# Patient Record
Sex: Female | Born: 1975
Health system: Southern US, Community
[De-identification: ages and names within clinical notes are randomized; demographics above are authoritative.]

## PROBLEM LIST (undated history)

## (undated) HISTORY — PX: OTHER SURGICAL HISTORY: SHX169

---

## 1998-09-30 ENCOUNTER — Emergency Department (HOSPITAL_COMMUNITY): Admission: EM | Admit: 1998-09-30 | Discharge: 1998-09-30 | Payer: Self-pay | Admitting: Emergency Medicine

## 2001-02-22 ENCOUNTER — Ambulatory Visit (HOSPITAL_BASED_OUTPATIENT_CLINIC_OR_DEPARTMENT_OTHER): Admission: RE | Admit: 2001-02-22 | Discharge: 2001-02-22 | Payer: Self-pay | Admitting: General Surgery

## 2001-02-22 ENCOUNTER — Encounter (INDEPENDENT_AMBULATORY_CARE_PROVIDER_SITE_OTHER): Payer: Self-pay | Admitting: Specialist

## 2008-07-14 ENCOUNTER — Encounter (INDEPENDENT_AMBULATORY_CARE_PROVIDER_SITE_OTHER): Payer: Self-pay | Admitting: General Surgery

## 2008-07-14 ENCOUNTER — Ambulatory Visit (HOSPITAL_BASED_OUTPATIENT_CLINIC_OR_DEPARTMENT_OTHER): Admission: RE | Admit: 2008-07-14 | Discharge: 2008-07-14 | Payer: Self-pay | Admitting: General Surgery

## 2010-12-28 NOTE — Op Note (Signed)
Patricia Walters                ACCOUNT NO.:  [id number]   MEDICAL RECORD NO.:  [id number]          PATIENT TYPE:  AMB   LOCATION:  [facility]                         FACILITY:  [facility]   PHYSICIAN:  Anselm Pancoast. Weatherly, M.D.DATE OF BIRTH:  01/27/1976   DATE OF PROCEDURE:  07/14/2008  DATE OF DISCHARGE:                               OPERATIVE REPORT   PREOPERATIVE DIAGNOSIS:  Recurrent hidradenitis left axilla.   OPERATION:  Excision of chronic hidradenitis left axilla with multiple  small abscesses.   HISTORY:  Patricia Walters is a 35 year old black female.  She has been was  seen in our office on numerous occasions for a hidradenitis.  She has  had problems on both the left and right.  I did a primary excision of  the area on the right and she has done well.  She was seen in August for  I and D on the left and wanted to go ahead and proceed, but then she had  been on Septra off and on and she works and elected not to proceed since  it appeared that she was getting better.  Then I saw her back in the  office on June 19, 2008, when she was having problems with purulent  drainage, taking Vicodin, she has been on Septra.  I saw her in the  office and lanced a little small area for a culture and this grew a  Proteus, and she desired to go ahead and plan on having a wide excision  as she had, had done on the right.  When I saw her at that time, she was  on the Septra which the Proteus appeared to be sensitive to the Septra,  but when she came today she had obviously multiple areas that were  swollen and tender.  There are other areas that have just started to  drain and she desires to go ahead and proceed with definitive surgery.  I discussed with her that I might not be able to actually close the  area, but we will see and she understands and we took her on back to the  OR.   DESCRIPTION OF PROCEDURE:  She was given a gram of Ancef, since this was  sensitive on the previous biopsy and  the Septra that she has been on  really does not appear to be controlling the issues and we have never  cultured any thing that was actually MRSA.  I then after induction of  general anesthesia with an LOA tube, lifted up the left arm and prepped  her widely with Betadine solution and then slipped a half sheet up under  the axilla and then draped the remaining areas with towels and then a  lap.  I used a skin marker to kind of mark around all the areas that  were palpable and this area is shaped like a drumstick in that it is  pretty large medially and then kind of like a little stick more lateral  where the newer areas of drainage are in the lateral area and then the  areas that are medially  in the meat of the drumstick area of course was  not this way saw her in the office about a week ago.  I then marked the  skin where I will need to make the incision to remove all of these  various areas.  I then starting inferiorly at the broad area, picking up  the skin with double skin hooks, dissecting down in basically non-  infected area and then took the inferior incision laterally.  I then  started up again in the top, lifting up the skin, kind of at the  axillary crease and dissected down.  There were numerous little blood  vessels that were quite large that were either sutured or tied with 4-0  Vicryl, and then all this chronic infected tissue was excised right down  to the actual pectoral fascia.  Then the lateral aspect of the skin  removed was only about an inch wide and there were several areas of  lateral bleeding also.  Good hemostasis was obtained.  I did not elevate  any flaps.  I think it would come together with her arm down.  After a  48 Blake had been placed in the depths of the wound and brought out  laterally, I did put a little Betadine solution in the wound and then  closed it with some interrupted sutures of 4-0 Vicryl, then little  mattress sutures about an inch apart of  3-0 nylon, then interrupted 4-0  nylon simple sutures and then staples between each of these.  The drain  had been sutured to the skin and estimated blood loss was probably 150  mL.  Then, I put Vaseline gauze on the skin to get a good seal.  The  Blake drain does have a good seal.  Then some 4x4s laid out flat and  then an ABD and then this held in place with the 4 inch Hypafix.  The  patient tolerated the procedure nicely and she will be released to be  followed up in our office on Thursday.  We will await the results of the  culture.  Her mother will be  assisting in her care, and I am going to place her on Keflex since this  was definitely sensitive to the Keflex previously.  We opened on the  back and sent some samples of pus and the infected tissue for aerobic  and anaerobic cultures to see if any additional bacteria was coming up.  She is at [facility], and will be discharged later today.           ______________________________  Anselm Pancoast. Zachery Dakins, M.D.     WJW/MEDQ  D:  07/14/2008  T:  07/14/2008  Job:  [id number]

## 2010-12-31 NOTE — Op Note (Signed)
[facility]  Patient:    Patricia Walters, Patricia Walters                         MRN: [id number] Proc. Date: 02/22/01 Adm. Date:  [id number] Attending:  Henrene Dodge                           Operative Report  PREOPERATIVE DIAGNOSIS:  Bilateral hidradenitis, axilla and groin.  POSTOPERATIVE DIAGNOSIS:  Excision of complex hidradenitis, both axillae, smaller lesions both left and right anterior groin.  ANESTHESIA:  General anesthesia.  SURGEON:  Anselm Pancoast. Zachery Dakins, M.D.  HISTORY:  Patricia Walters is a 35 year old black female who has had a long history of hidradenitis.  She has been on antibiotics intermittently but has never had any real surgical procedures, and she has recently graduated from college.  She was seen in the urgent care center about a month ago and placed on doxycycline and advised to see a Careers adviser.  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  She said she was somewhat better with the oral antibiotics, is not having any insurance coverage, since she had just graduated from college and was looking for employment and was not on her mothers policy and wanted to wait until she got health insurance before proceeding with any type of surgical procedure.  She had some many areas that it would have been impossible to try to drain any of these in the office, even though some of them are actually chronically draining fluid.  I have seen her twice since then, and the area is kind of smoldering along, and she is for planned surgery at this time.  I saw her earlier in the week.  She has about four large complex areas of the right axilla, about three in the left, two on each of the anterior groin area, both left and right.  The patient has been on doxycycline, and preoperatively she was given 1 g of  Kefzol.  DESCRIPTION OF PROCEDURE:  The patient was taken to the operative suite, induction of general anesthesia, endotracheal tube, and then positioned so that her legs were kind of slightly spread a little bit and we could get to both axillae, and I elected to do the groin areas, since they were smaller, first.  This area was prepped with Betadine solution, draped in a sterile manner.  I shaved all of this with the clippers.  The areas first on the left and then right, and they were about two kind of complex areas about the size of a fifty-cent piece, with chronic pus, infection, etc., and these were excised.  The bleeders were controlled with either cautery or 3-0 or 4-0 chromic sutures, and then I loosely closed the skin, placed a little iodoform gauze within the actual cavity, brought it out through the incision.  The same was used on both sides, and then multiple groin incisions, all told I think there were five or six areas in the groin, two large on one side and two large and the other two smaller on the right side.  Next, we went to the right axilla, and this was the worst, and I was hoping to just kind of excise the areas and not actually do a formal area, but  the area on the right, the fistulas all complex, draining, multiple communication, and I ultimately removed a large ellipse of skin, probably about two inches wide and about five inches in length.  That removes most of the hairbearing area on the right axilla.  There was a little area kind of going down into the arm that did not connect with these, and so she has a little bit of a T-type incision. Numerous bleeders were sutured or cauterized, and then I closed the area with just simple sutures with a Penrose drain in the large incision and iodoform in the smaller incision.  The left axilla was done last, and it basically had three large areas.  Each of these was about the size of a fifty-cent piece or larger, with multiple  complex abscesses, etc., and the chronic infected area was removed and then the skin was closed with iodoform gauze within the areas. I will see her in the office tomorrow for a wound check and hopefully remove the packing probably Sunday, and then she will need to, of course, be on oral antibiotics and limited activity.  I am planning to just let her soak in the shower on a daily basis starting tomorrow evening if we are not having problems with bleeding. DD:  02/22/01 TD:  02/22/01 Job: [id number]

## 2011-02-14 ENCOUNTER — Other Ambulatory Visit: Payer: Self-pay | Admitting: Obstetrics and Gynecology

## 2011-02-14 DIAGNOSIS — N644 Mastodynia: Secondary | ICD-10-CM

## 2011-02-25 ENCOUNTER — Other Ambulatory Visit (HOSPITAL_COMMUNITY): Payer: Self-pay | Admitting: Diagnostic Radiology

## 2011-02-25 ENCOUNTER — Ambulatory Visit
Admission: RE | Admit: 2011-02-25 | Discharge: 2011-02-25 | Disposition: A | Discharge status: Home / Self Care | Payer: PRIVATE HEALTH INSURANCE | Source: Ambulatory Visit | Attending: Obstetrics and Gynecology | Admitting: Obstetrics and Gynecology

## 2011-02-25 ENCOUNTER — Other Ambulatory Visit: Payer: Self-pay | Admitting: Obstetrics and Gynecology

## 2011-02-25 ENCOUNTER — Other Ambulatory Visit: Payer: Self-pay | Admitting: Diagnostic Radiology

## 2011-02-25 DIAGNOSIS — N644 Mastodynia: Secondary | ICD-10-CM

## 2011-02-25 DIAGNOSIS — N63 Unspecified lump in unspecified breast: Secondary | ICD-10-CM

## 2011-05-17 LAB — DIFFERENTIAL
Basophils Absolute: 0 10*3/uL (ref 0.0–0.1)
Basophils Relative: 0 % (ref 0–1)
Eosinophils Absolute: 0.1 10*3/uL (ref 0.0–0.7)
Eosinophils Relative: 1 % (ref 0–5)
Lymphs Abs: 3 10*3/uL (ref 0.7–4.0)
Neutrophils Relative %: 58 % (ref 43–77)

## 2011-05-17 LAB — CBC
Hemoglobin: 12.9 g/dL (ref 12.0–15.0)
MCHC: 32.6 g/dL (ref 30.0–36.0)
RBC: 4.64 MIL/uL (ref 3.87–5.11)
WBC: 9.2 10*3/uL (ref 4.0–10.5)

## 2011-05-17 LAB — COMPREHENSIVE METABOLIC PANEL
ALT: 15 U/L (ref 0–35)
AST: 19 U/L (ref 0–37)
Alkaline Phosphatase: 64 U/L (ref 39–117)
CO2: 26 mEq/L (ref 19–32)
Chloride: 104 mEq/L (ref 96–112)
GFR calc Af Amer: >60 mL/min (ref >60)
GFR calc non Af Amer: 51 mL/min — ABNORMAL LOW (ref >60)
Glucose, Bld: 94 mg/dL (ref 70–99)
Potassium: 4.1 mEq/L (ref 3.5–5.1)
Sodium: 136 mEq/L (ref 135–145)
Total Bilirubin: 0.6 mg/dL (ref 0.3–1.2)

## 2011-05-17 LAB — CULTURE, ROUTINE-ABSCESS

## 2011-05-17 LAB — ANAEROBIC CULTURE: Gram Stain: NONE SEEN

## 2011-05-17 LAB — PREGNANCY, URINE: Preg Test, Ur: NEGATIVE

## 2012-08-24 ENCOUNTER — Other Ambulatory Visit: Payer: Self-pay | Admitting: Obstetrics and Gynecology

## 2012-08-24 DIAGNOSIS — Z1231 Encounter for screening mammogram for malignant neoplasm of breast: Secondary | ICD-10-CM

## 2012-09-20 ENCOUNTER — Ambulatory Visit
Admission: RE | Admit: 2012-09-20 | Discharge: 2012-09-20 | Disposition: A | Discharge status: Home / Self Care | Payer: PRIVATE HEALTH INSURANCE | Source: Ambulatory Visit | Attending: Obstetrics and Gynecology | Admitting: Obstetrics and Gynecology

## 2012-09-20 DIAGNOSIS — Z1231 Encounter for screening mammogram for malignant neoplasm of breast: Secondary | ICD-10-CM

## 2012-10-03 ENCOUNTER — Other Ambulatory Visit: Payer: Self-pay | Admitting: Obstetrics and Gynecology

## 2012-10-30 ENCOUNTER — Other Ambulatory Visit: Payer: PRIVATE HEALTH INSURANCE

## 2012-10-31 ENCOUNTER — Ambulatory Visit
Admission: RE | Admit: 2012-10-31 | Discharge: 2012-10-31 | Disposition: A | Discharge status: Home / Self Care | Payer: PRIVATE HEALTH INSURANCE | Source: Ambulatory Visit | Attending: Obstetrics and Gynecology | Admitting: Obstetrics and Gynecology

## 2013-08-15 HISTORY — PX: BREAST BIOPSY: SHX20

## 2017-11-15 DIAGNOSIS — Z01419 Encounter for gynecological examination (general) (routine) without abnormal findings: Secondary | ICD-10-CM | POA: Diagnosis not present

## 2017-11-15 DIAGNOSIS — Z6841 Body Mass Index (BMI) 40.0 and over, adult: Secondary | ICD-10-CM | POA: Diagnosis not present

## 2017-11-15 DIAGNOSIS — Z1231 Encounter for screening mammogram for malignant neoplasm of breast: Secondary | ICD-10-CM | POA: Diagnosis not present

## 2017-11-16 LAB — HM PAP SMEAR: HPV, high-risk: NOT DETECTED

## 2018-07-30 ENCOUNTER — Emergency Department (HOSPITAL_BASED_OUTPATIENT_CLINIC_OR_DEPARTMENT_OTHER)
Admission: EM | Admit: 2018-07-30 | Discharge: 2018-07-30 | Disposition: A | Discharge status: Home / Self Care | Payer: BLUE CROSS/BLUE SHIELD | Attending: Emergency Medicine | Admitting: Emergency Medicine

## 2018-07-30 ENCOUNTER — Telehealth: Payer: Self-pay

## 2018-07-30 ENCOUNTER — Other Ambulatory Visit: Payer: Self-pay

## 2018-07-30 ENCOUNTER — Encounter (HOSPITAL_BASED_OUTPATIENT_CLINIC_OR_DEPARTMENT_OTHER): Payer: Self-pay | Admitting: *Deleted

## 2018-07-30 DIAGNOSIS — F17210 Nicotine dependence, cigarettes, uncomplicated: Secondary | ICD-10-CM | POA: Diagnosis not present

## 2018-07-30 DIAGNOSIS — R55 Syncope and collapse: Secondary | ICD-10-CM | POA: Diagnosis not present

## 2018-07-30 NOTE — Telephone Encounter (Signed)
Copied from CRM [id number]. Topic: Appointment Scheduling - Scheduling Inquiry for Clinic >> Jul 30, 2018  1:41 PM Lorrine KinMcGee, Demi B, [place] wrote: Reason for CRM: Requesting to be a new patient of Dr Beverely Lowabori. Was referred by a current patient, Charlean MerlLetitia Wallace. Covered under [employer]. Please advise.

## 2018-07-30 NOTE — ED Triage Notes (Signed)
Last night she had abdominal pain and menstrual cramps. States she passed out in the bathroom. She had nausea and diarrhea. Woke this am and felt fine. Her friends wanted her to come get a check up.

## 2018-07-30 NOTE — Telephone Encounter (Signed)
Ok to establish but my new pt appts are out quite far if she needs to see someone sooner

## 2018-07-30 NOTE — Telephone Encounter (Signed)
Please advise 

## 2018-07-30 NOTE — ED Notes (Signed)
Patient denies pain and is resting comfortably.  

## 2018-07-30 NOTE — ED Provider Notes (Signed)
[facility] EMERGENCY DEPARTMENT Provider Note   CSN: [id number] Arrival date & time: 07/30/18  1539     History   Chief Complaint Chief Complaint  Patient presents with  . Fall    HPI Patricia Walters is a 42 y.o. female.  The history is provided by the patient.  Near Syncope  This is a new problem. The current episode started yesterday. The problem has been resolved. Pertinent negatives include no chest pain, no abdominal pain, no headaches and no shortness of breath. Nothing aggravates the symptoms. Nothing relieves the symptoms. She has tried nothing for the symptoms. The treatment provided no relief.    History reviewed. No pertinent past medical history.  There are no active problems to display for this patient.   Past Surgical History:  Procedure Laterality Date  . sweat gland surgery       OB History   No obstetric history on file.      Home Medications    Prior to Admission medications   Not on File    Family History No family history on file.  Social History Social History   Tobacco Use  . Smoking status: Current Every Day Smoker  . Smokeless tobacco: Never Used  Substance Use Topics  . Alcohol use: Not on file  . Drug use: Not on file     Allergies   Patient has no known allergies.   Review of Systems Review of Systems  Constitutional: Negative for chills and fever.  HENT: Negative for ear pain and sore throat.   Eyes: Negative for pain and visual disturbance.  Respiratory: Negative for cough and shortness of breath.   Cardiovascular: Positive for near-syncope. Negative for chest pain and palpitations.  Gastrointestinal: Negative for abdominal pain and vomiting.  Genitourinary: Negative for dysuria and hematuria.  Musculoskeletal: Negative for arthralgias and back pain.  Skin: Negative for color change and rash.  Neurological: Positive for light-headedness. Negative for seizures, syncope and headaches.  All other systems  reviewed and are negative.    Physical Exam Updated Vital Signs  ED Triage Vitals  Enc Vitals Group     BP 07/30/18 1604 130/75     Pulse Rate 07/30/18 1604 99     Resp 07/30/18 1604 18     Temp 07/30/18 1604 98.6 F (37 C)     Temp Source 07/30/18 1604 Oral     SpO2 07/30/18 1604 100 %     Weight 07/30/18 1605 284 lb 2.8 oz (128.9 kg)     Height 07/30/18 1605 5\' 5"  (1.651 m)     Head Circumference --      Peak Flow --      Pain Score 07/30/18 1618 0     Pain Loc --      Pain Edu? --      Excl. in GC? --     Physical Exam Vitals signs and nursing note reviewed.  Constitutional:      General: She is not in acute distress.    Appearance: She is well-developed.  HENT:     Head: Normocephalic and atraumatic.     Nose: Nose normal.     Mouth/Throat:     Mouth: Mucous membranes are dry.  Eyes:     Extraocular Movements: Extraocular movements intact.     Conjunctiva/sclera: Conjunctivae normal.     Pupils: Pupils are equal, round, and reactive to light.  Neck:     Musculoskeletal: Normal range of motion and neck supple.  Cardiovascular:     Rate and Rhythm: Normal rate and regular rhythm.     Pulses: Normal pulses.     Heart sounds: Normal heart sounds. No murmur.  Pulmonary:     Effort: Pulmonary effort is normal. No respiratory distress.     Breath sounds: Normal breath sounds.  Abdominal:     General: Abdomen is flat. There is no distension.     Palpations: Abdomen is soft.     Tenderness: There is no abdominal tenderness. There is no guarding.  Musculoskeletal: Normal range of motion.  Skin:    General: Skin is warm and dry.     Capillary Refill: Capillary refill takes less than 2 seconds.  Neurological:     General: No focal deficit present.     Mental Status: She is alert and oriented to person, place, and time.     Cranial Nerves: No cranial nerve deficit.     Sensory: No sensory deficit.     Motor: No weakness.     Coordination: Coordination normal.      Gait: Gait normal.     Deep Tendon Reflexes: Reflexes normal.  Psychiatric:        Mood and Affect: Mood normal.      ED Treatments / Results  Labs (all labs ordered are listed, but only abnormal results are displayed) Labs Reviewed - No data to display  EKG EKG Interpretation  Date/Time:  Monday July 30 2018 19:23:12 EST Ventricular Rate:  93 PR Interval:    QRS Duration: 92 QT Interval:  344 QTC Calculation: 428 R Axis:   75 Text Interpretation:  Sinus rhythm Consider right atrial enlargement Baseline wander in lead(s) V2 Confirmed by Virgina NorfolkAdam, Nuria Phebus [phone]([id number]) on 07/30/2018 7:29:19 PM   Radiology No results found.  Procedures Procedures (including critical care time)  Medications Ordered in ED Medications - No data to display   Initial Impression / Assessment and Plan / ED Course  I have reviewed the triage vital signs and the nursing notes.  Pertinent labs & imaging results that were available during my care of the patient were reviewed by me and considered in my medical decision making (see chart for details).     Patricia Walters is a 42 year old female with no significant medical history who presents the ED after possible syncopal event yesterday.  Patient with normal vitals.  No fever.  Patient states that she had some nausea, diarrhea yesterday.  She states while trying to have a bowel movement yesterday she fell to the floor, felt warm and flushed just before event.  She did not hit her head.  No headache, no neck pain.  Has felt fine all day today.  A friend told her to come get evaluated.  She has no current symptoms upon my evaluation.  EKG shows sinus rhythm.  Overall exam is reassuring.  No abdominal tenderness on exam.  Possible patient had a vasovagal episode.  No concerns for arrhythmia on EKG.  Normal vitals.  Patient currently on her menstrual cycle.  Overall gave patient reassurance and discharged patient in good condition.  Given return precautions.     Final Clinical Impressions(s) / ED Diagnoses   Final diagnoses:  Vasovagal episode    ED Discharge Orders    None       Virgina NorfolkCuratolo, Jennaya Pogue, DO 07/31/18 [id number]

## 2018-08-10 ENCOUNTER — Ambulatory Visit (INDEPENDENT_AMBULATORY_CARE_PROVIDER_SITE_OTHER): Payer: BLUE CROSS/BLUE SHIELD | Admitting: Family Medicine

## 2018-08-10 ENCOUNTER — Encounter: Payer: Self-pay | Admitting: Family Medicine

## 2018-08-10 DIAGNOSIS — E282 Polycystic ovarian syndrome: Secondary | ICD-10-CM | POA: Insufficient documentation

## 2018-08-10 DIAGNOSIS — L732 Hidradenitis suppurativa: Secondary | ICD-10-CM | POA: Diagnosis not present

## 2018-08-10 DIAGNOSIS — R55 Syncope and collapse: Secondary | ICD-10-CM | POA: Insufficient documentation

## 2018-08-10 NOTE — Assessment & Plan Note (Signed)
New to provider, recurrent problem for pt.  Not currently symptomatic.  Will treat as issues arise.  Pt expressed understanding and is in agreement w/ plan.

## 2018-08-10 NOTE — Assessment & Plan Note (Signed)
New.  Reviewed ER note and EKG.  No labs done.  Pt's episode sounds consistent w/ vasovagal episode but will check labs to assess for other cause of syncope.  Encouraged increased water intake.  Will follow.

## 2018-08-10 NOTE — Progress Notes (Signed)
   Subjective:    Patient ID: Patricia Walters, female    DOB: Dec 21, 1975, 42 y.o.   MRN: [id number]  HPI New to establish.  Previous MD- [facility]  GYN- [facility], UTD on pap and has mammo scheduled.  UTD on flu shot  Syncope- pt had a syncopal event on 12/15.  She awoke from sleep w/ nausea, went to bathroom.  Started sweating, nausea, dizziness.  Sat down and next thing she knew she was on the floor before the toilet and tub.  Afterwards had diarrhea and fatigue but was otherwise ok.    PCOS- pt has been following w/ GYN.  Did not tolerate OCPs  Hidradenitis- pt reports intermittent flares in bilateral axillae.  Pt is usually able to treat this at home w/ triple abx ointment.  At times will need to come in for abxs.    Obesity- BMI is 45.79.  No regular exercise.  Not following any particular diet.  Admits to 'horrible' eating habits.  Denies CP, SOB, HAs.  Some intermittent swelling of feet/ankles.   Review of Systems For ROS see HPI     Objective:   Physical Exam Vitals signs reviewed.  Constitutional:      General: She is not in acute distress.    Appearance: Normal appearance. She is well-developed. She is obese.  HENT:     Head: Normocephalic and atraumatic.  Eyes:     Conjunctiva/sclera: Conjunctivae normal.     Pupils: Pupils are equal, round, and reactive to light.  Neck:     Musculoskeletal: Normal range of motion and neck supple.     Thyroid: No thyromegaly.  Cardiovascular:     Rate and Rhythm: Normal rate and regular rhythm.     Heart sounds: Normal heart sounds. No murmur.  Pulmonary:     Effort: Pulmonary effort is normal. No respiratory distress.     Breath sounds: Normal breath sounds.  Abdominal:     General: There is no distension.     Palpations: Abdomen is soft.     Tenderness: There is no abdominal tenderness.  Lymphadenopathy:     Cervical: No cervical adenopathy.  Skin:    General: Skin is warm and dry.  Neurological:   Mental Status: She is alert and oriented to person, place, and time.  Psychiatric:        Behavior: Behavior normal.           Assessment & Plan:

## 2018-08-10 NOTE — Patient Instructions (Signed)
Schedule your complete physical in 6 months We'll notify you of your lab results and make any changes if needed Continue to drink plenty of fluids!! Try and make healthy food choices and get regular exercise- you can do it!! Call with any questions or concerns Welcome!  We're glad to have you! Happy New Year!!!

## 2018-08-10 NOTE — Assessment & Plan Note (Signed)
New to provider, ongoing for pt.  Stressed need for healthy diet and regular exercise.  Check labs to risk stratify.  Will follow. 

## 2018-08-10 NOTE — Assessment & Plan Note (Signed)
New to provider, ongoing for pt.  Following w/ GYN.  Was intolerant to OCPs.  Will follow along and assist as able.

## 2018-08-11 LAB — CBC WITH DIFFERENTIAL/PLATELET
Absolute Monocytes: 634 cells/uL (ref 200–950)
BASOS ABS: 21 {cells}/uL (ref 0–200)
Basophils Relative: 0.2 %
EOS ABS: 114 {cells}/uL (ref 15–500)
EOS PCT: 1.1 %
HEMATOCRIT: 37 % (ref 35.0–45.0)
HEMOGLOBIN: 12 g/dL (ref 11.7–15.5)
Lymphs Abs: 3255 cells/uL (ref 850–3900)
MCH: 26.7 pg — AB (ref 27.0–33.0)
MCHC: 32.4 g/dL (ref 32.0–36.0)
MCV: 82.2 fL (ref 80.0–100.0)
MPV: 10.5 fL (ref 7.5–12.5)
Monocytes Relative: 6.1 %
NEUTROS PCT: 61.3 %
Neutro Abs: 6375 cells/uL (ref 1500–7800)
Platelets: 322 10*3/uL (ref 140–400)
RBC: 4.5 10*6/uL (ref 3.80–5.10)
RDW: 14.1 % (ref 11.0–15.0)
Total Lymphocyte: 31.3 %
WBC: 10.4 10*3/uL (ref 3.8–10.8)

## 2018-08-11 LAB — HEPATIC FUNCTION PANEL
AG RATIO: 1 (calc) (ref 1.0–2.5)
ALBUMIN MSPROF: 3.8 g/dL (ref 3.6–5.1)
ALT: 10 U/L (ref 6–29)
AST: 14 U/L (ref 10–30)
Alkaline phosphatase (APISO): 54 U/L (ref 33–115)
BILIRUBIN DIRECT: 0.1 mg/dL (ref 0.0–0.2)
BILIRUBIN TOTAL: 0.2 mg/dL (ref 0.2–1.2)
Globulin: 3.8 g/dL (calc) — ABNORMAL HIGH (ref 1.9–3.7)
Indirect Bilirubin: 0.1 mg/dL (calc) — ABNORMAL LOW (ref 0.2–1.2)
Total Protein: 7.6 g/dL (ref 6.1–8.1)

## 2018-08-11 LAB — LIPID PANEL
CHOLESTEROL: 167 mg/dL (ref <200)
HDL: 52 mg/dL (ref >50)
LDL Cholesterol (Calc): 98 mg/dL (calc)
Non-HDL Cholesterol (Calc): 115 mg/dL (calc) (ref <130)
TRIGLYCERIDES: 80 mg/dL (ref <150)
Total CHOL/HDL Ratio: 3.2 (calc) (ref <5.0)

## 2018-08-11 LAB — BASIC METABOLIC PANEL
BUN: 12 mg/dL (ref 7–25)
CALCIUM: 9.2 mg/dL (ref 8.6–10.2)
CO2: 28 mmol/L (ref 20–32)
Chloride: 102 mmol/L (ref 98–110)
Creat: 1.08 mg/dL (ref 0.50–1.10)
Glucose, Bld: 91 mg/dL (ref 65–99)
POTASSIUM: 3.9 mmol/L (ref 3.5–5.3)
SODIUM: 137 mmol/L (ref 135–146)

## 2018-08-11 LAB — TSH: TSH: 1.73 mIU/L

## 2018-08-11 LAB — HEMOGLOBIN A1C
Hgb A1c MFr Bld: 5.7 % of total Hgb — ABNORMAL HIGH (ref <5.7)
MEAN PLASMA GLUCOSE: 117 (calc)
eAG (mmol/L): 6.5 (calc)

## 2018-09-19 ENCOUNTER — Encounter: Payer: Self-pay | Admitting: Family Medicine

## 2018-09-19 ENCOUNTER — Other Ambulatory Visit: Payer: Self-pay

## 2018-09-19 ENCOUNTER — Ambulatory Visit (INDEPENDENT_AMBULATORY_CARE_PROVIDER_SITE_OTHER): Payer: BLUE CROSS/BLUE SHIELD | Admitting: Family Medicine

## 2018-09-19 VITALS — BP 128/82 | HR 85 | Temp 98.4°F | Resp 16 | Ht 67.0 in | Wt 286.2 lb

## 2018-09-19 DIAGNOSIS — J069 Acute upper respiratory infection, unspecified: Secondary | ICD-10-CM | POA: Diagnosis not present

## 2018-09-19 MED ORDER — FLUTICASONE PROPIONATE 50 MCG/ACT NA SUSP: 2.0000 | Freq: Every day | NASAL | 6 refills | Status: DC

## 2018-09-19 MED ORDER — PREDNISONE 10 MG PO TABS: ORAL_TABLET | ORAL | 0 refills | Status: DC

## 2018-09-19 NOTE — Progress Notes (Signed)
   Subjective:    Patient ID: Patricia Walters, female    DOB: 02-18-1976, 43 y.o.   MRN: [id number]  HPI URI- sxs started Sunday.  She took Mucinex and Catering manager cold for cough.  + nasal congestion, sneezing, watery eyes.  Cough is now productive, increased PND.  Some SOB w/ exertion and chest tightness.  + sinus pressure- 'behind the eyes really bad'.  + HA.  No tooth pain.  + nausea.  + dizziness.  No fevers.  + sick contacts.   Review of Systems For ROS see HPI     Objective:   Physical Exam Vitals signs reviewed.  Constitutional:      General: She is not in acute distress.    Appearance: Normal appearance. She is well-developed. She is obese.  HENT:     Head: Normocephalic and atraumatic.     Right Ear: Tympanic membrane normal.     Left Ear: Tympanic membrane normal.     Nose: Mucosal edema and rhinorrhea present.     Right Sinus: No maxillary sinus tenderness or frontal sinus tenderness.     Left Sinus: No maxillary sinus tenderness or frontal sinus tenderness.     Mouth/Throat:     Pharynx: Posterior oropharyngeal erythema (w/ PND) present.  Eyes:     Conjunctiva/sclera: Conjunctivae normal.     Pupils: Pupils are equal, round, and reactive to light.  Neck:     Musculoskeletal: Normal range of motion and neck supple.  Cardiovascular:     Rate and Rhythm: Normal rate and regular rhythm.     Heart sounds: Normal heart sounds.  Pulmonary:     Effort: Pulmonary effort is normal. No respiratory distress.     Breath sounds: Normal breath sounds. No wheezing or rales.     Comments: + hacking cough Lymphadenopathy:     Cervical: No cervical adenopathy.  Neurological:     General: No focal deficit present.     Mental Status: She is alert.           Assessment & Plan:  Viral URI- new.  No evidence of bacterial infxn so no need for abx.  Start daily antihistamine, nasal steroid.  OTC cough meds.  Add Prednisone to improve sinus inflammation.  Pt expressed understanding  and is in agreement w/ plan.

## 2018-09-19 NOTE — Patient Instructions (Signed)
Follow up as needed or as scheduled START the Prednisone as directed to improve inflammation and congestion- take w/ food Drink plenty of fluids REST! Start the Flonase- 2 sprays each nostril daily Add a daily Claritin or Zyrtec if you are not already taking Call with any questions or concerns Hang in there!

## 2019-02-11 ENCOUNTER — Encounter: Payer: BLUE CROSS/BLUE SHIELD | Admitting: Family Medicine

## 2019-02-11 DIAGNOSIS — Z6841 Body Mass Index (BMI) 40.0 and over, adult: Secondary | ICD-10-CM | POA: Diagnosis not present

## 2019-02-11 DIAGNOSIS — Z01419 Encounter for gynecological examination (general) (routine) without abnormal findings: Secondary | ICD-10-CM | POA: Diagnosis not present

## 2019-02-11 DIAGNOSIS — Z1231 Encounter for screening mammogram for malignant neoplasm of breast: Secondary | ICD-10-CM | POA: Diagnosis not present

## 2019-02-11 LAB — HM MAMMOGRAPHY: HM Mammogram: NORMAL (ref 0–4)

## 2019-02-11 LAB — HM PAP SMEAR

## 2019-02-13 ENCOUNTER — Other Ambulatory Visit: Payer: Self-pay | Admitting: Obstetrics and Gynecology

## 2019-02-13 DIAGNOSIS — R928 Other abnormal and inconclusive findings on diagnostic imaging of breast: Secondary | ICD-10-CM

## 2019-02-22 ENCOUNTER — Ambulatory Visit: Payer: BLUE CROSS/BLUE SHIELD

## 2019-02-22 ENCOUNTER — Ambulatory Visit
Admission: RE | Admit: 2019-02-22 | Discharge: 2019-02-22 | Disposition: A | Discharge status: Home / Self Care | Payer: BC Managed Care – PPO | Source: Ambulatory Visit | Attending: Obstetrics and Gynecology | Admitting: Obstetrics and Gynecology

## 2019-02-22 DIAGNOSIS — R928 Other abnormal and inconclusive findings on diagnostic imaging of breast: Secondary | ICD-10-CM | POA: Diagnosis not present

## 2019-03-04 ENCOUNTER — Ambulatory Visit (INDEPENDENT_AMBULATORY_CARE_PROVIDER_SITE_OTHER): Payer: BC Managed Care – PPO | Admitting: Family Medicine

## 2019-03-04 ENCOUNTER — Encounter: Payer: Self-pay | Admitting: Family Medicine

## 2019-03-04 ENCOUNTER — Other Ambulatory Visit: Payer: Self-pay

## 2019-03-04 VITALS — BP 122/72 | HR 90 | Temp 97.9°F | Resp 16 | Ht 67.0 in | Wt 292.4 lb

## 2019-03-04 DIAGNOSIS — L732 Hidradenitis suppurativa: Secondary | ICD-10-CM | POA: Diagnosis not present

## 2019-03-04 DIAGNOSIS — Z23 Encounter for immunization: Secondary | ICD-10-CM | POA: Diagnosis not present

## 2019-03-04 DIAGNOSIS — Z0000 Encounter for general adult medical examination without abnormal findings: Secondary | ICD-10-CM | POA: Diagnosis not present

## 2019-03-04 NOTE — Assessment & Plan Note (Signed)
Ongoing issue for pt.  Stressed need for healthy diet and regular exercise.  Check labs to risk stratify.  Will follow 

## 2019-03-04 NOTE — Assessment & Plan Note (Signed)
Pt would like derm referral due to scarring

## 2019-03-04 NOTE — Patient Instructions (Signed)
Follow up in 1 year or as needed We'll notify you of your lab results and make any changes if needed Continue to work on healthy diet and regular exercise- you can do it! We'll call you with your Dermatology appt Call with any questions or concerns Stay Safe!!!

## 2019-03-04 NOTE — Progress Notes (Signed)
   Subjective:    Patient ID: Patricia Walters, female    DOB: 1975/10/11, 43 y.o.   MRN: [id number]  HPI CPE- UTD on pap, mammo.  Due for Tdap.  Pt has gained 6 lbs since last visit.   Review of Systems Patient reports no vision/ hearing changes, adenopathy,fever, weight change,  persistant/recurrent hoarseness , swallowing issues, chest pain, palpitations, edema, persistant/recurrent cough, hemoptysis, dyspnea (rest/exertional/paroxysmal nocturnal), gastrointestinal bleeding (melena, rectal bleeding), abdominal pain, significant heartburn, bowel changes, GU symptoms (dysuria, hematuria, incontinence), Gyn symptoms (abnormal  bleeding, pain),  syncope, focal weakness, memory loss, numbness & tingling, skin/hair/nail changes, abnormal bruising or bleeding, anxiety, or depression.     Objective:   Physical Exam General Appearance:    Alert, cooperative, no distress, appears stated age, obese  Head:    Normocephalic, without obvious abnormality, atraumatic  Eyes:    PERRL, conjunctiva/corneas clear, EOM's intact, fundi    benign, both eyes  Ears:    Normal TM's and external ear canals, both ears  Nose:   Nares normal, septum midline, mucosa normal, no drainage    or sinus tenderness  Throat:   Lips, mucosa, and tongue normal; teeth and gums normal  Neck:   Supple, symmetrical, trachea midline, no adenopathy;    Thyroid: no enlargement/tenderness/nodules  Back:     Symmetric, no curvature, ROM normal, no CVA tenderness  Lungs:     Clear to auscultation bilaterally, respirations unlabored  Chest Wall:    No tenderness or deformity   Heart:    Regular rate and rhythm, S1 and S2 normal, no murmur, rub   or gallop  Breast Exam:    Deferred to GYN  Abdomen:     Soft, non-tender, bowel sounds active all four quadrants,    no masses, no organomegaly  Genitalia:    Deferred to GYN  Rectal:    Extremities:   Extremities normal, atraumatic, no cyanosis or edema  Pulses:   2+ and symmetric all  extremities  Skin:   Skin color, texture, turgor normal, no rashes or lesions  Lymph nodes:   Cervical, supraclavicular, and axillary nodes normal  Neurologic:   CNII-XII intact, normal strength, sensation and reflexes    throughout        Assessment & Plan:

## 2019-03-04 NOTE — Assessment & Plan Note (Signed)
Pt's PE WNL w/ exception of obesity.  UTD on GYN.  Tdap given today.  Check labs.  Anticipatory guidance provided.  

## 2019-03-04 NOTE — Addendum Note (Signed)
Addended by: Davis Gourd on: 03/04/2019 03:59 PM   Modules accepted: Orders

## 2019-03-05 LAB — BASIC METABOLIC PANEL
BUN: 11 mg/dL (ref 6–23)
CO2: 25 mEq/L (ref 19–32)
Calcium: 8.8 mg/dL (ref 8.4–10.5)
Chloride: 105 mEq/L (ref 96–112)
Creatinine, Ser: 1.34 mg/dL — ABNORMAL HIGH (ref 0.40–1.20)
GFR: 52.64 mL/min — ABNORMAL LOW (ref >60.00)
Glucose, Bld: 88 mg/dL (ref 70–99)
Potassium: 4 mEq/L (ref 3.5–5.1)
Sodium: 139 mEq/L (ref 135–145)

## 2019-03-05 LAB — HEPATIC FUNCTION PANEL
ALT: 14 U/L (ref 0–35)
AST: 14 U/L (ref 0–37)
Albumin: 3.7 g/dL (ref 3.5–5.2)
Alkaline Phosphatase: 57 U/L (ref 39–117)
Bilirubin, Direct: 0.1 mg/dL (ref 0.0–0.3)
Total Bilirubin: 0.2 mg/dL (ref 0.2–1.2)
Total Protein: 7.1 g/dL (ref 6.0–8.3)

## 2019-03-05 LAB — CBC WITH DIFFERENTIAL/PLATELET
Basophils Absolute: 0.1 10*3/uL (ref 0.0–0.1)
Basophils Relative: 0.8 % (ref 0.0–3.0)
Eosinophils Absolute: 0.1 10*3/uL (ref 0.0–0.7)
Eosinophils Relative: 1.1 % (ref 0.0–5.0)
HCT: 36.8 % (ref 36.0–46.0)
Hemoglobin: 11.9 g/dL — ABNORMAL LOW (ref 12.0–15.0)
Lymphocytes Relative: 25.9 % (ref 12.0–46.0)
Lymphs Abs: 3.4 10*3/uL (ref 0.7–4.0)
MCHC: 32.3 g/dL (ref 30.0–36.0)
MCV: 86.1 fl (ref 78.0–100.0)
Monocytes Absolute: 0.8 10*3/uL (ref 0.1–1.0)
Monocytes Relative: 6.2 % (ref 3.0–12.0)
Neutro Abs: 8.8 10*3/uL — ABNORMAL HIGH (ref 1.4–7.7)
Neutrophils Relative %: 66 % (ref 43.0–77.0)
Platelets: 334 10*3/uL (ref 150.0–400.0)
RBC: 4.27 Mil/uL (ref 3.87–5.11)
RDW: 15 % (ref 11.5–15.5)
WBC: 13.3 10*3/uL — ABNORMAL HIGH (ref 4.0–10.5)

## 2019-03-05 LAB — LIPID PANEL
Cholesterol: 147 mg/dL (ref 0–200)
HDL: 44.5 mg/dL (ref >39.00)
LDL Cholesterol: 88 mg/dL (ref 0–99)
NonHDL: 102.09
Total CHOL/HDL Ratio: 3
Triglycerides: 69 mg/dL (ref 0.0–149.0)
VLDL: 13.8 mg/dL (ref 0.0–40.0)

## 2019-03-05 LAB — TSH: TSH: 2.25 u[IU]/mL (ref 0.35–4.50)

## 2019-03-05 LAB — VITAMIN D 25 HYDROXY (VIT D DEFICIENCY, FRACTURES): VITD: 11.31 ng/mL — ABNORMAL LOW (ref 30.00–100.00)

## 2019-03-06 ENCOUNTER — Other Ambulatory Visit: Payer: Self-pay | Admitting: Family Medicine

## 2019-03-06 DIAGNOSIS — R7989 Other specified abnormal findings of blood chemistry: Secondary | ICD-10-CM

## 2019-03-06 DIAGNOSIS — D72829 Elevated white blood cell count, unspecified: Secondary | ICD-10-CM

## 2019-03-06 DIAGNOSIS — R799 Abnormal finding of blood chemistry, unspecified: Secondary | ICD-10-CM

## 2019-03-06 MED ORDER — VITAMIN D (ERGOCALCIFEROL) 1.25 MG (50000 UNIT) PO CAPS: 50000.0000 [IU] | ORAL_CAPSULE | ORAL | 0 refills | Status: DC

## 2019-03-06 NOTE — Progress Notes (Signed)
Called pt and lmovm to return call. Labs ordered, med filled to local pharmacy. Please schedule pt a lab only visit in 2 weeks.   [place] [facility] to Discuss results / PCP recommendations / Schedule patient.

## 2019-03-18 ENCOUNTER — Other Ambulatory Visit: Payer: Self-pay

## 2019-03-18 ENCOUNTER — Ambulatory Visit (INDEPENDENT_AMBULATORY_CARE_PROVIDER_SITE_OTHER): Payer: BC Managed Care – PPO | Admitting: Family Medicine

## 2019-03-18 DIAGNOSIS — R799 Abnormal finding of blood chemistry, unspecified: Secondary | ICD-10-CM

## 2019-03-18 DIAGNOSIS — D72829 Elevated white blood cell count, unspecified: Secondary | ICD-10-CM

## 2019-03-18 DIAGNOSIS — R7989 Other specified abnormal findings of blood chemistry: Secondary | ICD-10-CM

## 2019-03-18 LAB — BASIC METABOLIC PANEL
BUN: 11 mg/dL (ref 6–23)
CO2: 25 mEq/L (ref 19–32)
Calcium: 9.2 mg/dL (ref 8.4–10.5)
Chloride: 102 mEq/L (ref 96–112)
Creatinine, Ser: 1.1 mg/dL (ref 0.40–1.20)
GFR: 66.09 mL/min (ref >60.00)
Glucose, Bld: 113 mg/dL — ABNORMAL HIGH (ref 70–99)
Potassium: 4.1 mEq/L (ref 3.5–5.1)
Sodium: 137 mEq/L (ref 135–145)

## 2019-03-18 LAB — CBC WITH DIFFERENTIAL/PLATELET
Basophils Absolute: 0.2 10*3/uL — ABNORMAL HIGH (ref 0.0–0.1)
Basophils Relative: 1.4 % (ref 0.0–3.0)
Eosinophils Absolute: 0.1 10*3/uL (ref 0.0–0.7)
Eosinophils Relative: 0.8 % (ref 0.0–5.0)
HCT: 39 % (ref 36.0–46.0)
Hemoglobin: 12.7 g/dL (ref 12.0–15.0)
Lymphocytes Relative: 36 % (ref 12.0–46.0)
Lymphs Abs: 4.5 10*3/uL — ABNORMAL HIGH (ref 0.7–4.0)
MCHC: 32.4 g/dL (ref 30.0–36.0)
MCV: 85.9 fl (ref 78.0–100.0)
Monocytes Absolute: 0.9 10*3/uL (ref 0.1–1.0)
Monocytes Relative: 6.8 % (ref 3.0–12.0)
Neutro Abs: 7 10*3/uL (ref 1.4–7.7)
Neutrophils Relative %: 55 % (ref 43.0–77.0)
Platelets: 331 10*3/uL (ref 150.0–400.0)
RBC: 4.54 Mil/uL (ref 3.87–5.11)
RDW: 15 % (ref 11.5–15.5)
WBC: 12.6 10*3/uL — ABNORMAL HIGH (ref 4.0–10.5)

## 2019-04-15 DIAGNOSIS — L732 Hidradenitis suppurativa: Secondary | ICD-10-CM | POA: Diagnosis not present

## 2019-04-15 DIAGNOSIS — L0591 Pilonidal cyst without abscess: Secondary | ICD-10-CM | POA: Diagnosis not present

## 2019-08-05 DIAGNOSIS — E282 Polycystic ovarian syndrome: Secondary | ICD-10-CM | POA: Diagnosis not present

## 2019-08-05 DIAGNOSIS — R102 Pelvic and perineal pain: Secondary | ICD-10-CM | POA: Diagnosis not present

## 2019-08-05 DIAGNOSIS — N926 Irregular menstruation, unspecified: Secondary | ICD-10-CM | POA: Diagnosis not present

## 2019-08-12 DIAGNOSIS — R102 Pelvic and perineal pain: Secondary | ICD-10-CM | POA: Diagnosis not present

## 2019-08-12 DIAGNOSIS — E282 Polycystic ovarian syndrome: Secondary | ICD-10-CM | POA: Diagnosis not present

## 2019-12-12 IMAGING — MG DIGITAL DIAGNOSTIC UNILATERAL LEFT MAMMOGRAM WITH TOMO AND CAD
4 series · 4 of 12 positions shown · non-contrast
Comparison: Previous exam(s).

ACR Breast Density Category a: The breast tissue is almost entirely
fatty.

CLINICAL DATA: Screening recall for a possible asymmetry in the
left breast. Patient has history infected skin gland/bases cyst
along the inframammary fold of the left breast. Currently there is 1
that is having spontaneous drainage.

EXAM:
DIGITAL DIAGNOSTIC UNILATERAL LEFT MAMMOGRAM WITH CAD AND TOMO

[L MLO synth-2D]
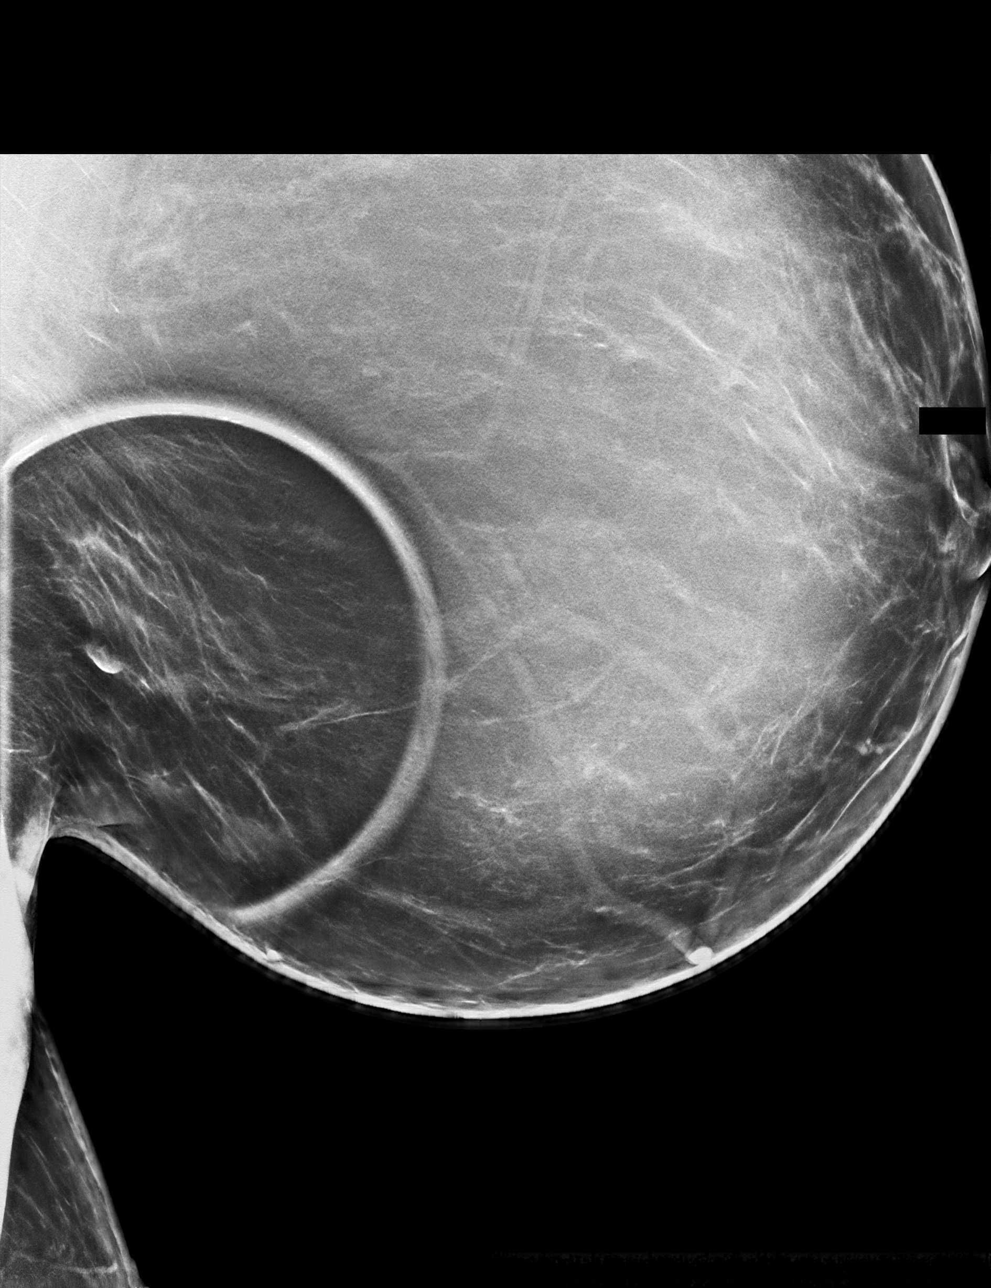

[L CC synth-2D]
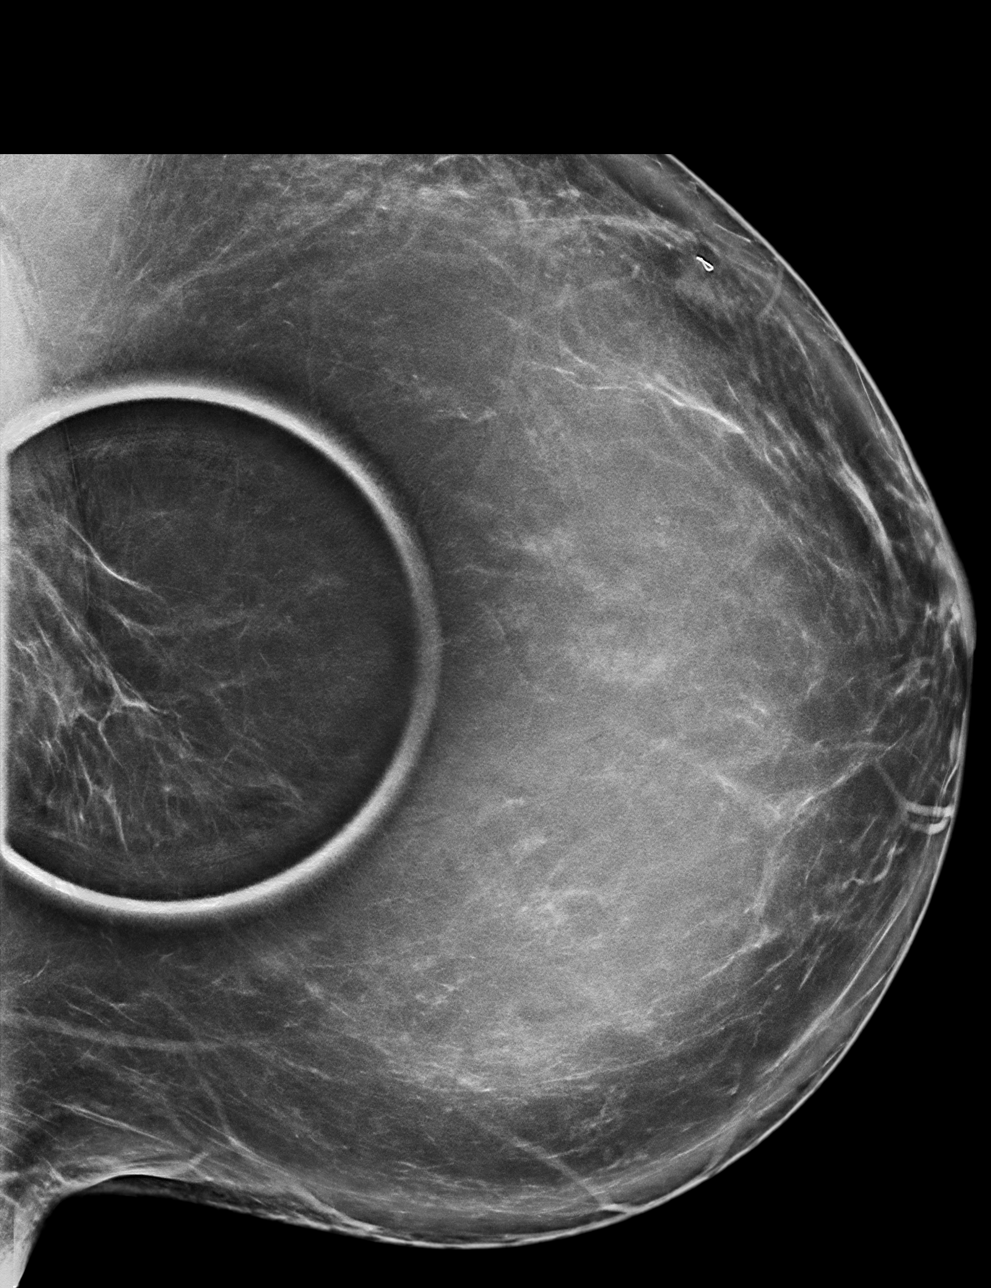

[L CC tomo · tomo slice 47/92.0]
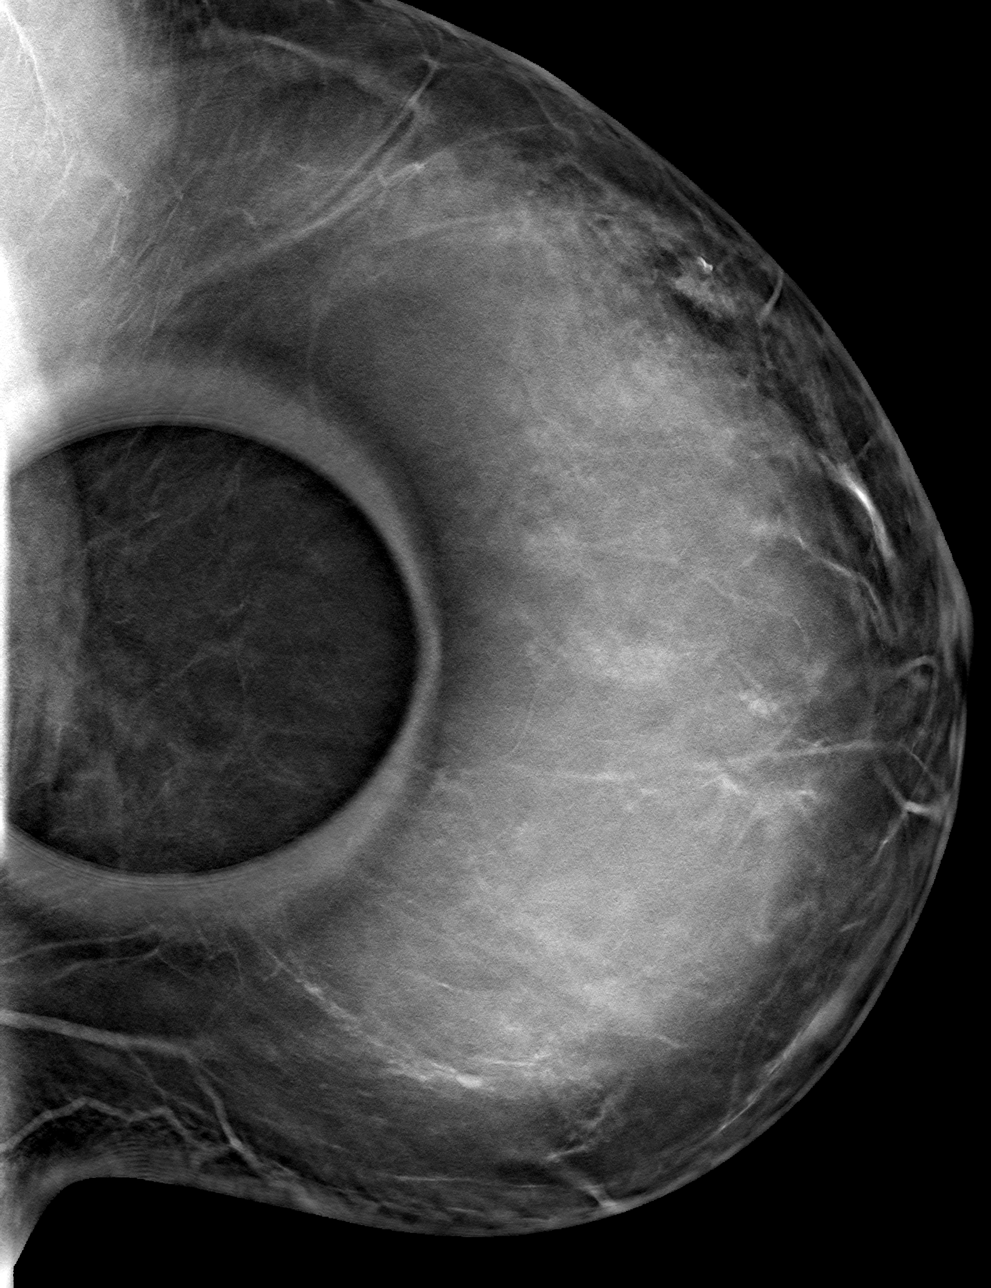

[L MLO tomo · tomo slice 47/94.0]
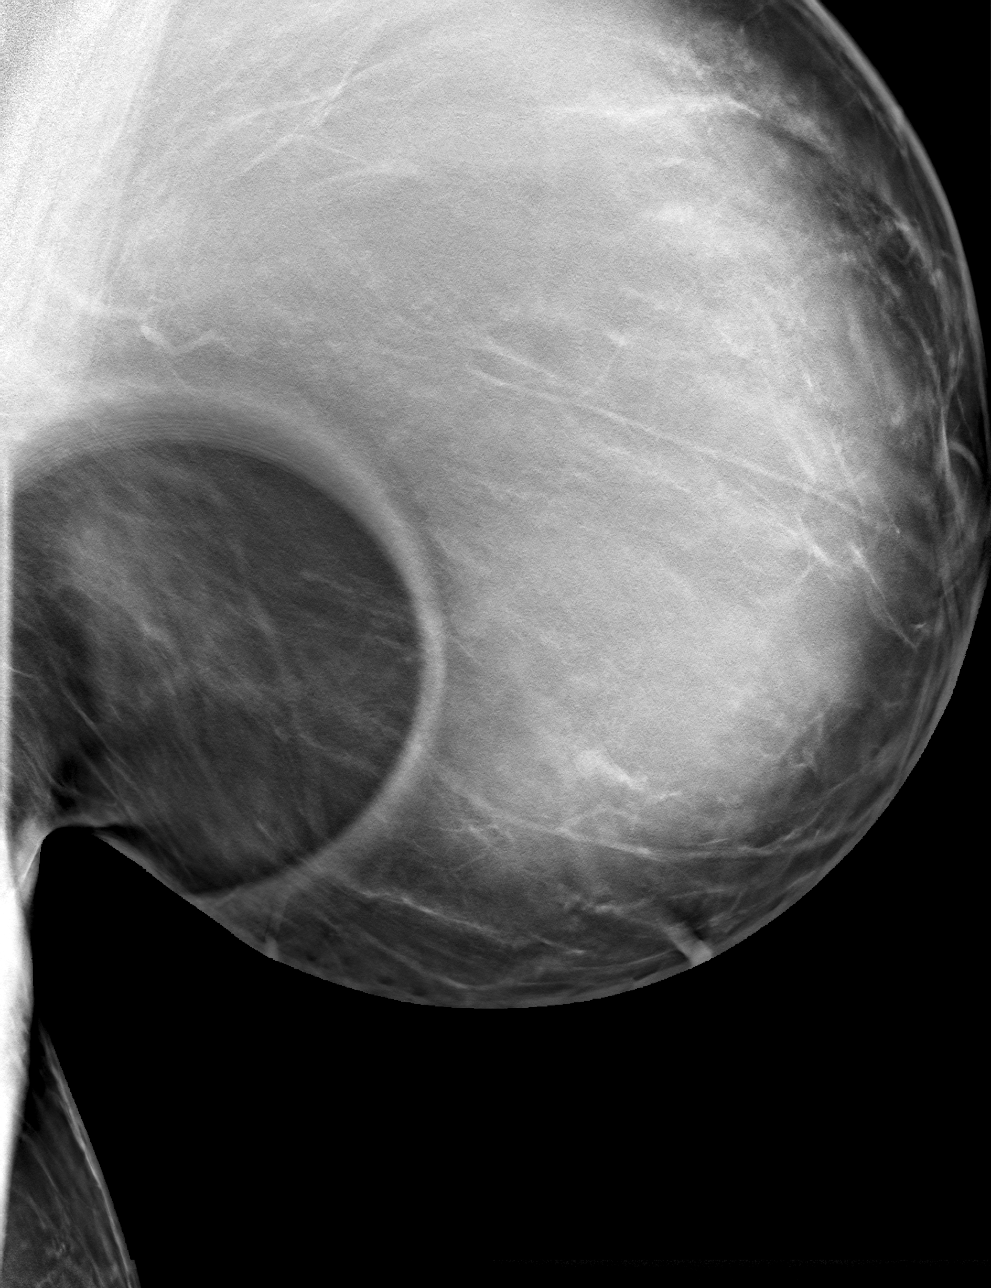

[4 of 12 positions shown; findings below may reference images not displayed]

FINDINGS: The area of asymmetry noted along the inferior aspect of the left
breast represents the inflamed sebaceous or inclusion cyst, noted
along the inferior aspect of the left breast near the inframammary
fold. There is no breast mass to suggest malignancy.

Mammographic images were processed with CAD.
IMPRESSION: 1. No evidence of breast malignancy.
2. Inflamed skin gland or sebaceous/inclusion cyst along the
inframammary region of the left breast accounts for the area of
asymmetry noted on the screening mammogram.

RECOMMENDATION:
Screening mammogram in one year.(Code:[id number]-1J6)

Clinical follow-up if the inflamed skin gland worsens or fails to
resolve.

I have discussed the findings and recommendations with the patient.
Results were also provided in writing at the conclusion of the
visit. If applicable, a reminder letter will be sent to the patient
regarding the next appointment.

BI-RADS CATEGORY  2: Benign.

## 2020-01-07 ENCOUNTER — Encounter (INDEPENDENT_AMBULATORY_CARE_PROVIDER_SITE_OTHER): Payer: BC Managed Care – PPO | Admitting: Family Medicine

## 2020-01-07 DIAGNOSIS — B9689 Other specified bacterial agents as the cause of diseases classified elsewhere: Secondary | ICD-10-CM | POA: Diagnosis not present

## 2020-01-07 DIAGNOSIS — N760 Acute vaginitis: Secondary | ICD-10-CM

## 2020-01-09 MED ORDER — METRONIDAZOLE 500 MG PO TABS: 500.0000 mg | ORAL_TABLET | Freq: Two times a day (BID) | ORAL | 0 refills | Status: DC

## 2020-01-09 NOTE — Telephone Encounter (Signed)
Cumulative Time: 5 minutes Consent: Pt reached out via MyChart and gave consent People Involved: pt, CMA (Jess B), myself CC: bacterial infxn HPI: starting 3-4 days ago pt developed a vaginal discharge and a fishy odor.  Has hx of BV that was treated w/ flagyl.  Pt is asking for refill.  Has not tried any OTC treatments. AP: BV- Flagyl 500mg  BID x7 days

## 2020-01-31 ENCOUNTER — Encounter: Payer: Self-pay | Admitting: Family Medicine

## 2020-03-09 ENCOUNTER — Encounter: Payer: BC Managed Care – PPO | Admitting: Family Medicine

## 2020-04-13 ENCOUNTER — Ambulatory Visit (INDEPENDENT_AMBULATORY_CARE_PROVIDER_SITE_OTHER): Payer: BC Managed Care – PPO | Admitting: Family Medicine

## 2020-04-13 ENCOUNTER — Encounter: Payer: Self-pay | Admitting: Family Medicine

## 2020-04-13 ENCOUNTER — Other Ambulatory Visit: Payer: Self-pay

## 2020-04-13 VITALS — BP 121/80 | HR 100 | Temp 98.6°F | Resp 16 | Ht 67.0 in | Wt 290.0 lb

## 2020-04-13 DIAGNOSIS — E559 Vitamin D deficiency, unspecified: Secondary | ICD-10-CM

## 2020-04-13 DIAGNOSIS — Z0000 Encounter for general adult medical examination without abnormal findings: Secondary | ICD-10-CM

## 2020-04-13 LAB — CBC WITH DIFFERENTIAL/PLATELET
Basophils Absolute: 0 10*3/uL (ref 0.0–0.1)
Basophils Relative: 0.4 % (ref 0.0–3.0)
Eosinophils Absolute: 0.1 10*3/uL (ref 0.0–0.7)
Eosinophils Relative: 0.6 % (ref 0.0–5.0)
HCT: 35.6 % — ABNORMAL LOW (ref 36.0–46.0)
Hemoglobin: 11.8 g/dL — ABNORMAL LOW (ref 12.0–15.0)
Lymphocytes Relative: 28.5 % (ref 12.0–46.0)
Lymphs Abs: 3.1 10*3/uL (ref 0.7–4.0)
MCHC: 33.1 g/dL (ref 30.0–36.0)
MCV: 84.2 fl (ref 78.0–100.0)
Monocytes Absolute: 0.6 10*3/uL (ref 0.1–1.0)
Monocytes Relative: 5.2 % (ref 3.0–12.0)
Neutro Abs: 7 10*3/uL (ref 1.4–7.7)
Neutrophils Relative %: 65.3 % (ref 43.0–77.0)
Platelets: 326 10*3/uL (ref 150.0–400.0)
RBC: 4.23 Mil/uL (ref 3.87–5.11)
RDW: 14.6 % (ref 11.5–15.5)
WBC: 10.7 10*3/uL — ABNORMAL HIGH (ref 4.0–10.5)

## 2020-04-13 LAB — BASIC METABOLIC PANEL
BUN: 14 mg/dL (ref 6–23)
CO2: 25 mEq/L (ref 19–32)
Calcium: 9 mg/dL (ref 8.4–10.5)
Chloride: 104 mEq/L (ref 96–112)
Creatinine, Ser: 1.26 mg/dL — ABNORMAL HIGH (ref 0.40–1.20)
GFR: 56.21 mL/min — ABNORMAL LOW (ref >60.00)
Glucose, Bld: 91 mg/dL (ref 70–99)
Potassium: 4.4 mEq/L (ref 3.5–5.1)
Sodium: 137 mEq/L (ref 135–145)

## 2020-04-13 LAB — LIPID PANEL
Cholesterol: 154 mg/dL (ref 0–200)
HDL: 51.3 mg/dL (ref >39.00)
NonHDL: 102.33
Total CHOL/HDL Ratio: 3
Triglycerides: 205 mg/dL — ABNORMAL HIGH (ref 0.0–149.0)
VLDL: 41 mg/dL — ABNORMAL HIGH (ref 0.0–40.0)

## 2020-04-13 LAB — HEPATIC FUNCTION PANEL
ALT: 14 U/L (ref 0–35)
AST: 14 U/L (ref 0–37)
Albumin: 3.7 g/dL (ref 3.5–5.2)
Alkaline Phosphatase: 53 U/L (ref 39–117)
Bilirubin, Direct: 0 mg/dL (ref 0.0–0.3)
Total Bilirubin: 0.2 mg/dL (ref 0.2–1.2)
Total Protein: 7.2 g/dL (ref 6.0–8.3)

## 2020-04-13 LAB — VITAMIN D 25 HYDROXY (VIT D DEFICIENCY, FRACTURES): VITD: 27.26 ng/mL — ABNORMAL LOW (ref 30.00–100.00)

## 2020-04-13 LAB — LDL CHOLESTEROL, DIRECT: Direct LDL: 73 mg/dL

## 2020-04-13 LAB — TSH: TSH: 2.61 u[IU]/mL (ref 0.35–4.50)

## 2020-04-13 NOTE — Progress Notes (Signed)
   Subjective:    Patient ID: Patricia Walters, female    DOB: 01-Aug-1976, 44 y.o.   MRN: [id number]  HPI CPE- UTD on pap, due for mammo.  UTD on Tdap, COVID.  Will get flu shot at work.  No concerns today.  Reviewed past medical, surgical, family and social histories.   Health Maintenance  Topic Date Due  . INFLUENZA VACCINE  11/12/2020 (Originally 03/15/2020)  . Hepatitis C Screening  04/13/2021 (Originally [birth year])  . HIV Screening  04/13/2021 (Originally 10/09/1992)  . PAP SMEAR-Modifier  02/10/2022  . TETANUS/TDAP  03/03/2029  . COVID-19 Vaccine  Completed    Patient Care Team    Relationship Specialty Notifications Start End  Sheliah Hatch, MD PCP - General Family Medicine  08/10/18   Ginette Otto, [facility] Physician   08/10/18       Review of Systems Patient reports no vision/ hearing changes, adenopathy,fever, weight change,  persistant/recurrent hoarseness , swallowing issues, chest pain, palpitations, edema, persistant/recurrent cough, hemoptysis, dyspnea (rest/exertional/paroxysmal nocturnal), gastrointestinal bleeding (melena, rectal bleeding), abdominal pain, significant heartburn, bowel changes, GU symptoms (dysuria, hematuria, incontinence), Gyn symptoms (abnormal  bleeding, pain),  syncope, focal weakness, memory loss, numbness & tingling, skin/hair/nail changes, abnormal bruising or bleeding, anxiety, or depression.   This visit occurred during the SARS-CoV-2 public health emergency.  Safety protocols were in place, including screening questions prior to the visit, additional usage of staff PPE, and extensive cleaning of exam room while observing appropriate contact time as indicated for disinfecting solutions.       Objective:   Physical Exam General Appearance:    Alert, cooperative, no distress, appears stated age, obese  Head:    Normocephalic, without obvious abnormality, atraumatic  Eyes:    PERRL, conjunctiva/corneas clear, EOM's  intact, fundi    benign, both eyes  Ears:    Normal TM's and external ear canals, both ears  Nose:   Deferred due to COVID  Throat:   Neck:   Supple, symmetrical, trachea midline, no adenopathy;    Thyroid: no enlargement/tenderness/nodules  Back:     Symmetric, no curvature, ROM normal, no CVA tenderness  Lungs:     Clear to auscultation bilaterally, respirations unlabored  Chest Wall:    No tenderness or deformity   Heart:    Regular rate and rhythm, S1 and S2 normal, no murmur, rub   or gallop  Breast Exam:    Deferred to GYN  Abdomen:     Soft, non-tender, bowel sounds active all four quadrants,    no masses, no organomegaly  Genitalia:    Deferred to GYN  Rectal:    Extremities:   Extremities normal, atraumatic, no cyanosis or edema  Pulses:   2+ and symmetric all extremities  Skin:   Skin color, texture, turgor normal, no rashes or lesions  Lymph nodes:   Cervical, supraclavicular, and axillary nodes normal  Neurologic:   CNII-XII intact, normal strength, sensation and reflexes    throughout          Assessment & Plan:

## 2020-04-13 NOTE — Assessment & Plan Note (Signed)
Pt's PE WNL w/ exception of obesity.  UTD on COVID, Tdap, pap- due for mammo.  Pt to schedule.  Check labs.  Stressed need for healthy diet and regular exercise.  Anticipatory guidance provided.

## 2020-04-13 NOTE — Assessment & Plan Note (Signed)
Ongoing issue for pt.  She admits she has not been doing well w/ diet and exercise.  Check labs to risk stratify.  Will follow.

## 2020-04-13 NOTE — Patient Instructions (Signed)
Follow up in 6 months to recheck weight loss progress We'll notify you of your lab results and make any changes if needed Continue to work on healthy diet and regular exercise- you can do it! Try and meal prep and plan ahead Call with any questions or concerns Stay Safe!  Stay Healthy!

## 2020-04-14 ENCOUNTER — Other Ambulatory Visit: Payer: Self-pay | Admitting: General Practice

## 2020-04-14 MED ORDER — VITAMIN D (ERGOCALCIFEROL) 1.25 MG (50000 UNIT) PO CAPS: 50000.0000 [IU] | ORAL_CAPSULE | ORAL | 0 refills | Status: DC

## 2020-04-14 NOTE — Progress Notes (Signed)
Called pt and lmovm to return call.  Vitamin D prescription filled to pharmacy.

## 2020-04-15 ENCOUNTER — Encounter: Payer: Self-pay | Admitting: General Practice

## 2020-05-04 ENCOUNTER — Encounter: Payer: Self-pay | Admitting: Family Medicine

## 2020-05-04 ENCOUNTER — Other Ambulatory Visit: Payer: Self-pay | Admitting: General Practice

## 2020-05-04 MED ORDER — SPRINTEC 28 0.25-35 MG-MCG PO TABS: 1.0000 | ORAL_TABLET | Freq: Every day | ORAL | 12 refills | Status: DC

## 2020-07-13 ENCOUNTER — Other Ambulatory Visit: Payer: Self-pay | Admitting: Family Medicine

## 2020-10-05 ENCOUNTER — Ambulatory Visit: Payer: BC Managed Care – PPO | Admitting: Family Medicine

## 2020-11-09 ENCOUNTER — Ambulatory Visit: Payer: BC Managed Care – PPO | Admitting: Family Medicine

## 2020-11-09 ENCOUNTER — Encounter: Payer: Self-pay | Admitting: Family Medicine

## 2020-11-09 ENCOUNTER — Other Ambulatory Visit: Payer: Self-pay

## 2020-11-09 DIAGNOSIS — Z720 Tobacco use: Secondary | ICD-10-CM | POA: Diagnosis not present

## 2020-11-09 LAB — HEPATIC FUNCTION PANEL
ALT: 16 U/L (ref 0–35)
AST: 18 U/L (ref 0–37)
Albumin: 3.8 g/dL (ref 3.5–5.2)
Alkaline Phosphatase: 53 U/L (ref 39–117)
Bilirubin, Direct: 0 mg/dL (ref 0.0–0.3)
Total Bilirubin: 0.2 mg/dL (ref 0.2–1.2)
Total Protein: 8.1 g/dL (ref 6.0–8.3)

## 2020-11-09 LAB — BASIC METABOLIC PANEL
BUN: 14 mg/dL (ref 6–23)
CO2: 25 mEq/L (ref 19–32)
Calcium: 9.3 mg/dL (ref 8.4–10.5)
Chloride: 103 mEq/L (ref 96–112)
Creatinine, Ser: 1.1 mg/dL (ref 0.40–1.20)
GFR: 61.73 mL/min (ref >60.00)
Glucose, Bld: 105 mg/dL — ABNORMAL HIGH (ref 70–99)
Potassium: 4.1 mEq/L (ref 3.5–5.1)
Sodium: 136 mEq/L (ref 135–145)

## 2020-11-09 LAB — CBC WITH DIFFERENTIAL/PLATELET
Basophils Absolute: 0 10*3/uL (ref 0.0–0.1)
Basophils Relative: 0.3 % (ref 0.0–3.0)
Eosinophils Absolute: 0.1 10*3/uL (ref 0.0–0.7)
Eosinophils Relative: 0.5 % (ref 0.0–5.0)
HCT: 38.1 % (ref 36.0–46.0)
Hemoglobin: 12.8 g/dL (ref 12.0–15.0)
Lymphocytes Relative: 29.7 % (ref 12.0–46.0)
Lymphs Abs: 2.9 10*3/uL (ref 0.7–4.0)
MCHC: 33.7 g/dL (ref 30.0–36.0)
MCV: 83.9 fl (ref 78.0–100.0)
Monocytes Absolute: 0.5 10*3/uL (ref 0.1–1.0)
Monocytes Relative: 4.9 % (ref 3.0–12.0)
Neutro Abs: 6.4 10*3/uL (ref 1.4–7.7)
Neutrophils Relative %: 64.6 % (ref 43.0–77.0)
Platelets: 357 10*3/uL (ref 150.0–400.0)
RBC: 4.54 Mil/uL (ref 3.87–5.11)
RDW: 14.9 % (ref 11.5–15.5)
WBC: 9.9 10*3/uL (ref 4.0–10.5)

## 2020-11-09 LAB — LIPID PANEL
Cholesterol: 190 mg/dL (ref 0–200)
HDL: 68.3 mg/dL (ref >39.00)
LDL Cholesterol: 97 mg/dL (ref 0–99)
NonHDL: 121.22
Total CHOL/HDL Ratio: 3
Triglycerides: 122 mg/dL (ref 0.0–149.0)
VLDL: 24.4 mg/dL (ref 0.0–40.0)

## 2020-11-09 LAB — HEMOGLOBIN A1C: Hgb A1c MFr Bld: 6.3 % (ref 4.6–6.5)

## 2020-11-09 LAB — TSH: TSH: 3.57 u[IU]/mL (ref 0.35–4.50)

## 2020-11-09 LAB — VITAMIN D 25 HYDROXY (VIT D DEFICIENCY, FRACTURES): VITD: 31.86 ng/mL (ref 30.00–100.00)

## 2020-11-09 NOTE — Assessment & Plan Note (Signed)
Ongoing issue for pt.  She has cut back on her cigar smoking but she is still smoking ~2/day.  Encouraged her to cut back and try and quit entirely.  Will continue to follow.

## 2020-11-09 NOTE — Progress Notes (Signed)
   Subjective:    Patient ID: Patricia Walters, female    DOB: 22-Aug-1975, 45 y.o.   MRN: [id number]  HPI Morbid obesity- pt has gained 6 lbs since August visit.  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  That leaves little time to take care of herself.  Is eating late when she gets home from work.  Not eating breakfast, will eat lunch, and then late dinner.  Tobacco use- pt has not increased her tobacco use despite her increased stressors.  Currently smoking ~2 miniature cigars/day.  Is trying to cut that back even more.   Review of Systems For ROS see HPI   This visit occurred during the SARS-CoV-2 public health emergency.  Safety protocols were in place, including screening questions prior to the visit, additional usage of staff PPE, and extensive cleaning of exam room while observing appropriate contact time as indicated for disinfecting solutions.       Objective:   Physical Exam Vitals reviewed.  Constitutional:      General: She is not in acute distress.    Appearance: Normal appearance. She is well-developed. She is obese.  HENT:     Head: Normocephalic and atraumatic.  Eyes:     Conjunctiva/sclera: Conjunctivae normal.     Pupils: Pupils are equal, round, and reactive to light.  Neck:     Thyroid: No thyromegaly.  Cardiovascular:     Rate and Rhythm: Normal rate and regular rhythm.     Heart sounds: Normal heart sounds. No murmur heard.   Pulmonary:     Effort: Pulmonary effort is normal. No respiratory distress.     Breath sounds: Normal breath sounds.  Abdominal:     General: There is no distension.     Palpations: Abdomen is soft.     Tenderness: There is no abdominal tenderness.  Musculoskeletal:     Cervical back: Normal range of motion and neck supple.     Right lower leg: No edema.     Left lower leg: No edema.  Lymphadenopathy:     Cervical: No cervical adenopathy.  Skin:    General:  Skin is warm and dry.  Neurological:     Mental Status: She is alert and oriented to person, place, and time.  Psychiatric:        Behavior: Behavior normal.          Assessment & Plan:

## 2020-11-09 NOTE — Patient Instructions (Signed)
Schedule your complete physical in 6 months We'll notify you of your lab results and make any changes if needed Try and add small snacks throughout your day Drink LOTS of water Any physical activity is better than not.  Try and do the little things Continue to cut back on the cigars- you're doing great!! Call with any questions or concerns Hang in there!  You've got this!!!

## 2020-11-09 NOTE — Assessment & Plan Note (Signed)
Deteriorated.  Pt has gained 6 lbs since August visit.  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  She is incredibly busy and has little time for exercise or self care.  Encouraged small dietary changes- eating breakfast, snacking throughout the day, smaller lunch and dinner portions, increased water intake.  Also discussed small changes to add some physical activity.  Parking farther away, taking the stairs, etc.  Check labs to risk stratify.  Will follow.

## 2020-11-11 NOTE — Progress Notes (Signed)
Results reviewed via Mychart.

## 2021-02-10 ENCOUNTER — Encounter: Payer: Self-pay | Admitting: *Deleted

## 2021-03-30 ENCOUNTER — Other Ambulatory Visit: Payer: Self-pay | Admitting: Family Medicine

## 2021-05-25 ENCOUNTER — Encounter: Payer: Self-pay | Admitting: Family Medicine

## 2021-06-03 LAB — HM MAMMOGRAPHY

## 2021-07-23 ENCOUNTER — Encounter: Payer: BC Managed Care – PPO | Admitting: Family Medicine

## 2021-07-26 ENCOUNTER — Encounter: Payer: No Typology Code available for payment source | Admitting: Family Medicine

## 2021-09-06 ENCOUNTER — Other Ambulatory Visit: Payer: Self-pay

## 2021-09-06 ENCOUNTER — Ambulatory Visit (INDEPENDENT_AMBULATORY_CARE_PROVIDER_SITE_OTHER): Payer: No Typology Code available for payment source | Admitting: Family Medicine

## 2021-09-06 ENCOUNTER — Encounter: Payer: Self-pay | Admitting: Family Medicine

## 2021-09-06 VITALS — BP 126/86 | HR 70 | Temp 97.6°F | Resp 16 | Ht 67.0 in | Wt 292.8 lb

## 2021-09-06 DIAGNOSIS — Z0000 Encounter for general adult medical examination without abnormal findings: Secondary | ICD-10-CM

## 2021-09-06 DIAGNOSIS — Z114 Encounter for screening for human immunodeficiency virus [HIV]: Secondary | ICD-10-CM

## 2021-09-06 DIAGNOSIS — Z1159 Encounter for screening for other viral diseases: Secondary | ICD-10-CM

## 2021-09-06 LAB — CBC WITH DIFFERENTIAL/PLATELET
Basophils Absolute: 0 10*3/uL (ref 0.0–0.1)
Basophils Relative: 0.2 % (ref 0.0–3.0)
Eosinophils Absolute: 0 10*3/uL (ref 0.0–0.7)
Eosinophils Relative: 0.5 % (ref 0.0–5.0)
HCT: 36.9 % (ref 36.0–46.0)
Hemoglobin: 11.9 g/dL — ABNORMAL LOW (ref 12.0–15.0)
Lymphocytes Relative: 32.4 % (ref 12.0–46.0)
Lymphs Abs: 2.7 10*3/uL (ref 0.7–4.0)
MCHC: 32.2 g/dL (ref 30.0–36.0)
MCV: 84.1 fl (ref 78.0–100.0)
Monocytes Absolute: 0.5 10*3/uL (ref 0.1–1.0)
Monocytes Relative: 5.8 % (ref 3.0–12.0)
Neutro Abs: 5.1 10*3/uL (ref 1.4–7.7)
Neutrophils Relative %: 61.1 % (ref 43.0–77.0)
Platelets: 392 10*3/uL (ref 150.0–400.0)
RBC: 4.39 Mil/uL (ref 3.87–5.11)
RDW: 14.4 % (ref 11.5–15.5)
WBC: 8.4 10*3/uL (ref 4.0–10.5)

## 2021-09-06 LAB — LIPID PANEL
Cholesterol: 179 mg/dL (ref 0–200)
HDL: 55.5 mg/dL (ref >39.00)
NonHDL: 123.71
Total CHOL/HDL Ratio: 3
Triglycerides: 216 mg/dL — ABNORMAL HIGH (ref 0.0–149.0)
VLDL: 43.2 mg/dL — ABNORMAL HIGH (ref 0.0–40.0)

## 2021-09-06 LAB — BASIC METABOLIC PANEL
BUN: 13 mg/dL (ref 6–23)
CO2: 27 mEq/L (ref 19–32)
Calcium: 9.3 mg/dL (ref 8.4–10.5)
Chloride: 100 mEq/L (ref 96–112)
Creatinine, Ser: 1.07 mg/dL (ref 0.40–1.20)
GFR: 63.44 mL/min (ref >60.00)
Glucose, Bld: 91 mg/dL (ref 70–99)
Potassium: 4.6 mEq/L (ref 3.5–5.1)
Sodium: 137 mEq/L (ref 135–145)

## 2021-09-06 LAB — HEPATIC FUNCTION PANEL
ALT: 13 U/L (ref 0–35)
AST: 14 U/L (ref 0–37)
Albumin: 3.8 g/dL (ref 3.5–5.2)
Alkaline Phosphatase: 56 U/L (ref 39–117)
Bilirubin, Direct: 0 mg/dL (ref 0.0–0.3)
Total Bilirubin: 0.2 mg/dL (ref 0.2–1.2)
Total Protein: 7.7 g/dL (ref 6.0–8.3)

## 2021-09-06 LAB — VITAMIN D 25 HYDROXY (VIT D DEFICIENCY, FRACTURES): VITD: 39.82 ng/mL (ref 30.00–100.00)

## 2021-09-06 LAB — TSH: TSH: 2.14 u[IU]/mL (ref 0.35–5.50)

## 2021-09-06 LAB — LDL CHOLESTEROL, DIRECT: Direct LDL: 83 mg/dL

## 2021-09-06 NOTE — Assessment & Plan Note (Signed)
Ongoing issue for pt.  Discussed various options today at length.  She is not interested in surgery.  Doesn't have time to attend regular [facility] appts.  Is open to idea of medication.  Will check A1C and start either Wegovy if normal or Ozempic if pre-diabetes.  Pt expressed understanding and is in agreement w/ plan.

## 2021-09-06 NOTE — Patient Instructions (Addendum)
Follow up in 4-6 weeks to recheck weight loss progress We'll notify you of your lab results and make any changes if needed Continue to work on healthy diet and regular exercise- you can do it! Once you know the A1C, we'll send in either the [facility] or [facility] Call with any questions or concerns Stay Safe!  Stay Healthy! Happy New Year!!!

## 2021-09-06 NOTE — Assessment & Plan Note (Signed)
Pt's PE WNL w/ exception of obesity.  UTD on pap, mammo, immunizations.  Check labs.  Anticipatory guidance provided.  

## 2021-09-06 NOTE — Progress Notes (Signed)
° °  Subjective:    Patient ID: Patricia Walters, female    DOB: April 13, 1976, 46 y.o.   MRN: [id number]  HPI CPE- UTD on pap and mammo ([facility]) UTD on Tdap, flu.  Patient Care Team    Relationship Specialty Notifications Start End  Midge Minium, MD PCP - General Family Medicine  08/10/18   Lady Gary, [facility] Physician   08/10/18     Health Maintenance  Topic Date Due   HIV Screening  Never done   Hepatitis C Screening  Never done   COVID-19 Vaccine (3 - Booster for Moderna series) 09/22/2021 (Originally 09/25/2020)   PAP SMEAR-Modifier  02/10/2022   TETANUS/TDAP  03/03/2029   INFLUENZA VACCINE  Completed   HPV VACCINES  Aged Out      Review of Systems Patient reports no vision/ hearing changes, adenopathy,fever, weight change,  persistant/recurrent hoarseness , swallowing issues, chest pain, palpitations, edema, persistant/recurrent cough, hemoptysis, dyspnea (rest/exertional/paroxysmal nocturnal), gastrointestinal bleeding (melena, rectal bleeding), abdominal pain, significant heartburn, bowel changes, GU symptoms (dysuria, hematuria, incontinence), Gyn symptoms (abnormal  bleeding, pain),  syncope, focal weakness, memory loss, numbness & tingling, skin/hair/nail changes, abnormal bruising or bleeding, anxiety, or depression.   This visit occurred during the SARS-CoV-2 public health emergency.  Safety protocols were in place, including screening questions prior to the visit, additional usage of staff PPE, and extensive cleaning of exam room while observing appropriate contact time as indicated for disinfecting solutions.      Objective:   Physical Exam General Appearance:    Alert, cooperative, no distress, appears stated age  Head:    Normocephalic, without obvious abnormality, atraumatic  Eyes:    PERRL, conjunctiva/corneas clear, EOM's intact, fundi    benign, both eyes  Ears:    Normal TM's and external ear canals, both ears  Nose:    Nares normal, septum midline, mucosa normal, no drainage    or sinus tenderness  Throat:   Lips, mucosa, and tongue normal; teeth and gums normal  Neck:   Supple, symmetrical, trachea midline, no adenopathy;    Thyroid: no enlargement/tenderness/nodules  Back:     Symmetric, no curvature, ROM normal, no CVA tenderness  Lungs:     Clear to auscultation bilaterally, respirations unlabored  Chest Wall:    No tenderness or deformity   Heart:    Regular rate and rhythm, S1 and S2 normal, II/VI SEM at LUSB  Breast Exam:    Deferred to GYN  Abdomen:     Soft, non-tender, bowel sounds active all four quadrants,    no masses, no organomegaly  Genitalia:    Deferred to GYN  Rectal:    Extremities:   Extremities normal, atraumatic, no cyanosis or edema  Pulses:   2+ and symmetric all extremities  Skin:   Skin color, texture, turgor normal, no rashes or lesions  Lymph nodes:   Cervical, supraclavicular, and axillary nodes normal  Neurologic:   CNII-XII intact, normal strength, sensation and reflexes    throughout          Assessment & Plan:

## 2021-09-07 LAB — HIV ANTIBODY (ROUTINE TESTING W REFLEX): HIV 1&2 Ab, 4th Generation: NONREACTIVE

## 2021-09-07 LAB — HEPATITIS C ANTIBODY
Hepatitis C Ab: NONREACTIVE
SIGNAL TO CUT-OFF: <0.02 (ref <1.00)

## 2021-10-04 ENCOUNTER — Ambulatory Visit: Payer: No Typology Code available for payment source | Admitting: Family Medicine

## 2022-12-19 ENCOUNTER — Encounter: Payer: Self-pay | Admitting: Family Medicine

## 2022-12-19 ENCOUNTER — Ambulatory Visit (INDEPENDENT_AMBULATORY_CARE_PROVIDER_SITE_OTHER): Payer: BC Managed Care – PPO | Admitting: Family Medicine

## 2022-12-19 VITALS — BP 128/80 | HR 83 | Temp 98.9°F | Resp 17 | Ht 67.0 in | Wt 293.4 lb

## 2022-12-19 DIAGNOSIS — Z1211 Encounter for screening for malignant neoplasm of colon: Secondary | ICD-10-CM

## 2022-12-19 DIAGNOSIS — Z0000 Encounter for general adult medical examination without abnormal findings: Secondary | ICD-10-CM | POA: Diagnosis not present

## 2022-12-19 NOTE — Patient Instructions (Signed)
Follow up in 1 year or as needed We'll notify you of your lab results and make any changes if needed Continue to work on healthy diet and regular exercise- you can do it!!! Try and quit smoking as your stress level improves- you got this!!! Call with any questions or concerns Stay Safe!  Stay Healthy! Have a great summer!!!

## 2022-12-19 NOTE — Assessment & Plan Note (Signed)
Weight and BMI are stable at 293 and 45.95.  Encouraged low carb diet, regular physical activity.  Check labs to risk stratify.  Will follow.

## 2022-12-19 NOTE — Progress Notes (Signed)
   Subjective:    Patient ID: Patricia Walters, female    DOB: [birth year], 47 y.o.   MRN: [id number]  HPI CPE- due for pap, colon cancer screen.  No family hx of colon cancer.    Patient Care Team    Relationship Specialty Notifications Start End  Sheliah Hatch, MD PCP - General Family Medicine  08/10/18   Ginette Otto, [facility] Physician   08/10/18      Health Maintenance  Topic Date Due   PAP SMEAR-Modifier  02/10/2022   COVID-19 Vaccine (4 - 2023-24 season) 04/15/2022   COLONOSCOPY (Pts 45-74yrs Insurance coverage will need to be confirmed)  Never done   INFLUENZA VACCINE  03/16/2023   DTaP/Tdap/Td (2 - Td or Tdap) 03/03/2029   Hepatitis C Screening  Completed   HIV Screening  Completed   HPV VACCINES  Aged Out      Review of Systems Patient reports no vision/ hearing changes, adenopathy,fever, weight change,  persistant/recurrent hoarseness , swallowing issues, chest pain, palpitations, edema, persistant/recurrent cough, hemoptysis, dyspnea (rest/exertional/paroxysmal nocturnal), gastrointestinal bleeding (melena, rectal bleeding), abdominal pain, significant heartburn, bowel changes, GU symptoms (dysuria, hematuria, incontinence), Gyn symptoms (abnormal  bleeding, pain),  syncope, focal weakness, memory loss, numbness & tingling, skin/hair/nail changes, abnormal bruising or bleeding, anxiety, or depression.     Objective:   Physical Exam General Appearance:    Alert, cooperative, no distress, appears stated age, obese  Head:    Normocephalic, without obvious abnormality, atraumatic  Eyes:    PERRL, conjunctiva/corneas clear, EOM's intact both eyes  Ears:    Normal TM's and external ear canals, both ears  Nose:   Nares normal, septum midline, mucosa normal, no drainage    or sinus tenderness  Throat:   Lips, mucosa, and tongue normal; teeth and gums normal  Neck:   Supple, symmetrical, trachea midline, no adenopathy;    Thyroid: no  enlargement/tenderness/nodules  Back:     Symmetric, no curvature, ROM normal, no CVA tenderness  Lungs:     Clear to auscultation bilaterally, respirations unlabored  Chest Wall:    No tenderness or deformity   Heart:    Regular rate and rhythm, S1 and S2 normal, no murmur, rub   or gallop  Breast Exam:    Deferred to GYN  Abdomen:     Soft, non-tender, bowel sounds active all four quadrants,    no masses, no organomegaly  Genitalia:    Deferred to GYN  Rectal:    Extremities:   Extremities normal, atraumatic, no cyanosis or edema  Pulses:   2+ and symmetric all extremities  Skin:   Skin color, texture, turgor normal, no rashes or lesions  Lymph nodes:   Cervical, supraclavicular, and axillary nodes normal  Neurologic:   CNII-XII intact, normal strength, sensation and reflexes    throughout          Assessment & Plan:

## 2022-12-19 NOTE — Assessment & Plan Note (Signed)
Pt's PE WNL w/ exception of BMI.  Due for pap and mammo- pt to schedule w/ GYN.  Cologuard ordered.  Check labs.  Anticipatory guidance provided.

## 2022-12-20 LAB — BASIC METABOLIC PANEL
BUN: 11 mg/dL (ref 6–23)
CO2: 25 mEq/L (ref 19–32)
Calcium: 9.6 mg/dL (ref 8.4–10.5)
Chloride: 100 mEq/L (ref 96–112)
Creatinine, Ser: 1.07 mg/dL (ref 0.40–1.20)
GFR: 62.87 mL/min (ref >60.00)
Glucose, Bld: 53 mg/dL — ABNORMAL LOW (ref 70–99)
Potassium: 4.7 mEq/L (ref 3.5–5.1)
Sodium: 133 mEq/L — ABNORMAL LOW (ref 135–145)

## 2022-12-20 LAB — CBC WITH DIFFERENTIAL/PLATELET
Basophils Absolute: 0.1 10*3/uL (ref 0.0–0.1)
Basophils Relative: 0.8 % (ref 0.0–3.0)
Eosinophils Absolute: 0.1 10*3/uL (ref 0.0–0.7)
Eosinophils Relative: 0.7 % (ref 0.0–5.0)
HCT: 38 % (ref 36.0–46.0)
Hemoglobin: 12.3 g/dL (ref 12.0–15.0)
Lymphocytes Relative: 33.4 % (ref 12.0–46.0)
Lymphs Abs: 3.6 10*3/uL (ref 0.7–4.0)
MCHC: 32.5 g/dL (ref 30.0–36.0)
MCV: 84.3 fl (ref 78.0–100.0)
Monocytes Absolute: 0.6 10*3/uL (ref 0.1–1.0)
Monocytes Relative: 5.9 % (ref 3.0–12.0)
Neutro Abs: 6.4 10*3/uL (ref 1.4–7.7)
Neutrophils Relative %: 59.2 % (ref 43.0–77.0)
Platelets: 381 10*3/uL (ref 150.0–400.0)
RBC: 4.51 Mil/uL (ref 3.87–5.11)
RDW: 17.2 % — ABNORMAL HIGH (ref 11.5–15.5)
WBC: 10.9 10*3/uL — ABNORMAL HIGH (ref 4.0–10.5)

## 2022-12-20 LAB — LIPID PANEL
Cholesterol: 158 mg/dL (ref 0–200)
HDL: 48.5 mg/dL (ref >39.00)
LDL Cholesterol: 86 mg/dL (ref 0–99)
NonHDL: 109.21
Total CHOL/HDL Ratio: 3
Triglycerides: 116 mg/dL (ref 0.0–149.0)
VLDL: 23.2 mg/dL (ref 0.0–40.0)

## 2022-12-20 LAB — HEPATIC FUNCTION PANEL
ALT: 15 U/L (ref 0–35)
AST: 17 U/L (ref 0–37)
Albumin: 3.9 g/dL (ref 3.5–5.2)
Alkaline Phosphatase: 64 U/L (ref 39–117)
Bilirubin, Direct: 0.1 mg/dL (ref 0.0–0.3)
Total Bilirubin: 0.3 mg/dL (ref 0.2–1.2)
Total Protein: 8.6 g/dL — ABNORMAL HIGH (ref 6.0–8.3)

## 2022-12-20 LAB — VITAMIN D 25 HYDROXY (VIT D DEFICIENCY, FRACTURES): VITD: 28.89 ng/mL — ABNORMAL LOW (ref 30.00–100.00)

## 2022-12-20 LAB — TSH: TSH: 3.1 u[IU]/mL (ref 0.35–5.50)

## 2022-12-21 ENCOUNTER — Telehealth: Payer: Self-pay

## 2022-12-21 NOTE — Telephone Encounter (Signed)
-----   Message from Sheliah Hatch, MD sent at 12/21/2022  7:30 AM EDT ----- Labs look good w/ exception of mildly low Vit D.  Please make sure you are taking a daily OTC supplement of at least 2000 units

## 2022-12-21 NOTE — Telephone Encounter (Signed)
Informed pt of lab results  

## 2023-12-20 ENCOUNTER — Encounter: Payer: BC Managed Care – PPO | Admitting: Family Medicine

## 2024-02-19 ENCOUNTER — Encounter: Admitting: Family Medicine

## 2024-04-26 ENCOUNTER — Encounter: Payer: Self-pay | Admitting: Family Medicine

## 2024-04-29 ENCOUNTER — Encounter: Payer: Self-pay | Admitting: Family Medicine

## 2024-05-01 ENCOUNTER — Encounter: Payer: Self-pay | Admitting: Obstetrics and Gynecology

## 2024-08-26 ENCOUNTER — Encounter: Payer: Self-pay | Admitting: Family Medicine

## 2024-10-28 ENCOUNTER — Encounter: Payer: Self-pay | Admitting: Family Medicine
